# Patient Record
Sex: Male | Born: 1955 | Race: White | Hispanic: No | Marital: Married | Smoking: Never smoker
Health system: Southern US, Community
[De-identification: ages and names within clinical notes are randomized; demographics above are authoritative.]

## PROBLEM LIST (undated history)

## (undated) DIAGNOSIS — R972 Elevated prostate specific antigen [PSA]: Secondary | ICD-10-CM

## (undated) DIAGNOSIS — L02212 Cutaneous abscess of back [any part, except buttock]: Secondary | ICD-10-CM

## (undated) HISTORY — DX: Elevated prostate specific antigen (PSA): R97.20

## (undated) HISTORY — PX: JOINT REPLACEMENT: SHX530

## (undated) HISTORY — PX: TONSILLECTOMY: SUR1361

## (undated) HISTORY — PX: EYE SURGERY: SHX253

## (undated) HISTORY — DX: Cutaneous abscess of back (any part, except buttock): L02.212

---

## 2004-09-14 HISTORY — PX: COLONOSCOPY: SHX174

## 2005-05-21 ENCOUNTER — Ambulatory Visit: Payer: Self-pay | Admitting: Unknown Physician Specialty

## 2005-09-21 ENCOUNTER — Ambulatory Visit (HOSPITAL_COMMUNITY): Admission: RE | Admit: 2005-09-21 | Discharge: 2005-09-21 | Payer: Self-pay | Admitting: Orthopedic Surgery

## 2005-10-12 ENCOUNTER — Inpatient Hospital Stay (HOSPITAL_COMMUNITY): Admission: RE | Admit: 2005-10-12 | Discharge: 2005-10-16 | Payer: Self-pay | Admitting: Orthopedic Surgery

## 2005-11-23 ENCOUNTER — Inpatient Hospital Stay (HOSPITAL_COMMUNITY): Admission: RE | Admit: 2005-11-23 | Discharge: 2005-11-25 | Payer: Self-pay | Admitting: Orthopedic Surgery

## 2007-11-22 IMAGING — US US ABDOMEN COMPLETE
1 series · 14 of 25 positions shown · non-contrast
Comparison: None.

CLINICAL DATA: Fatty liver.  Elevated LFTs.
 ABDOMEN ULTRASOUND COMPLETE ? 10/15/05:
TECHNIQUE: Complete abdominal ultrasound examination was performed including evaluation of the liver, gallbladder, bile ducts, pancreas, kidneys, spleen, IVC, and abdominal aorta.

[Series 1: unknown · 0.34mm/px · 14 of 49 slices shown]
[im 1/49]
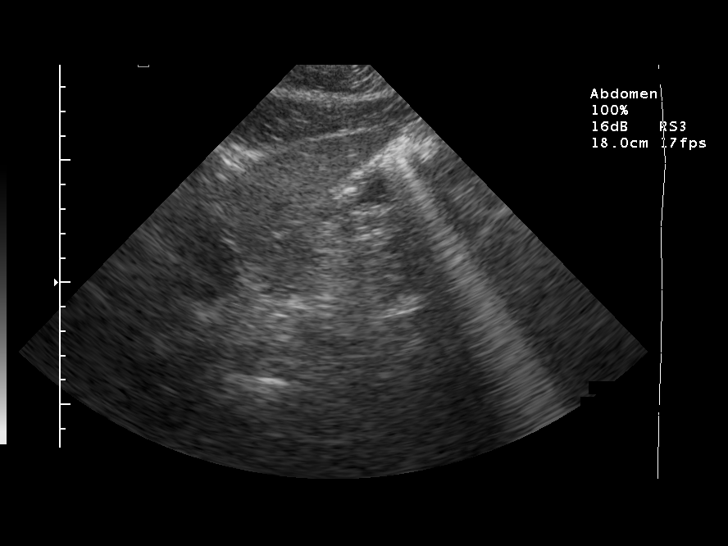
[im 5/49]
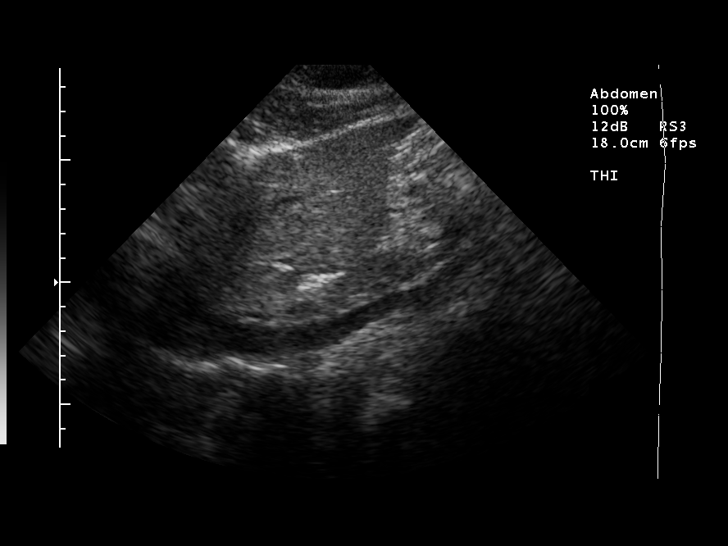
[im 9/49]
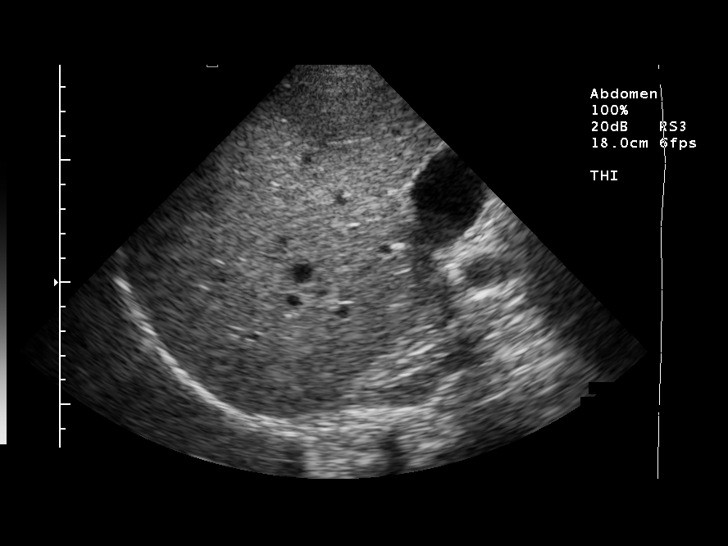
[im 13/49]
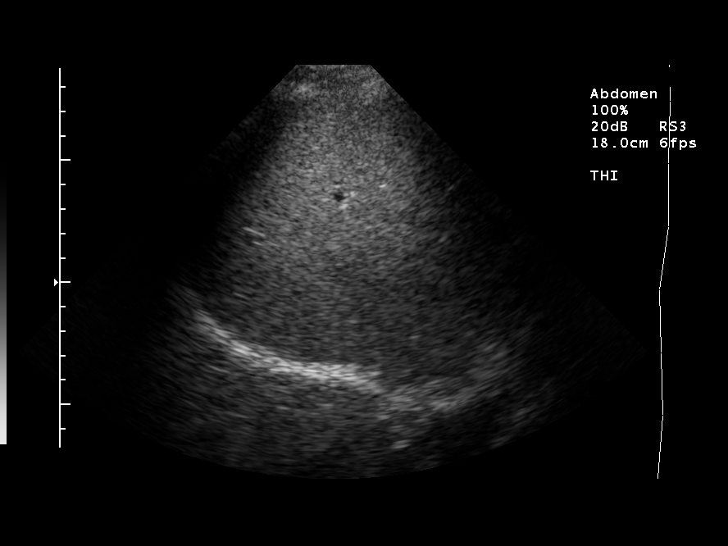
[im 17/49]
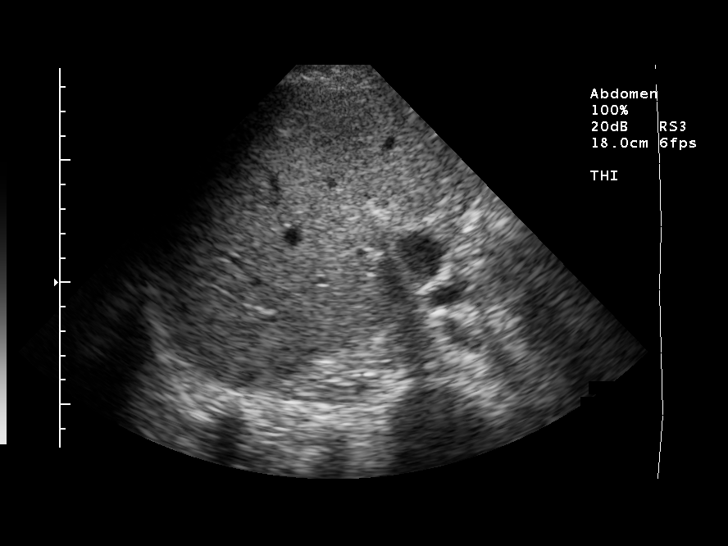
[im 19/49]
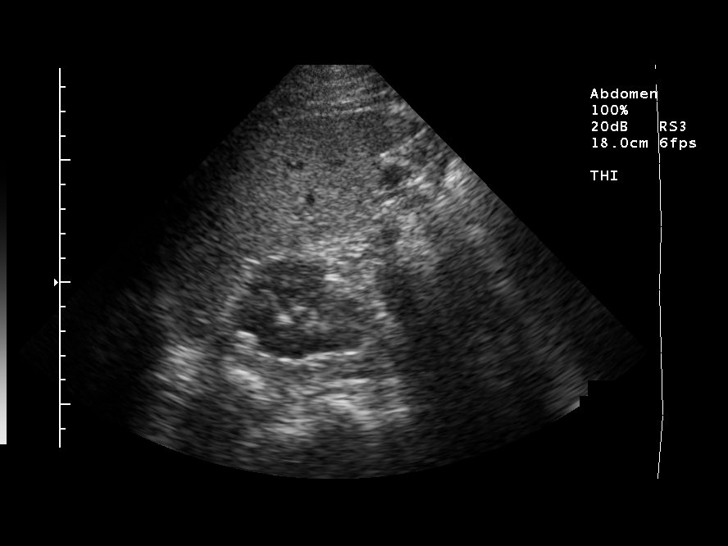
[im 23/49]
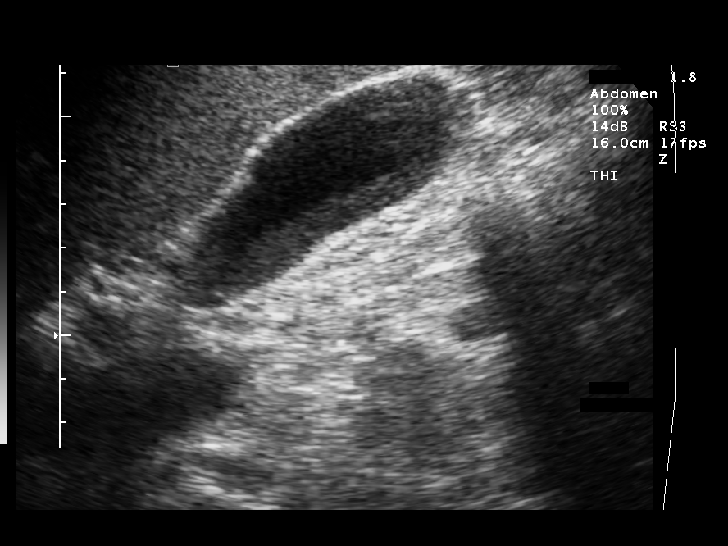
[im 27/49]
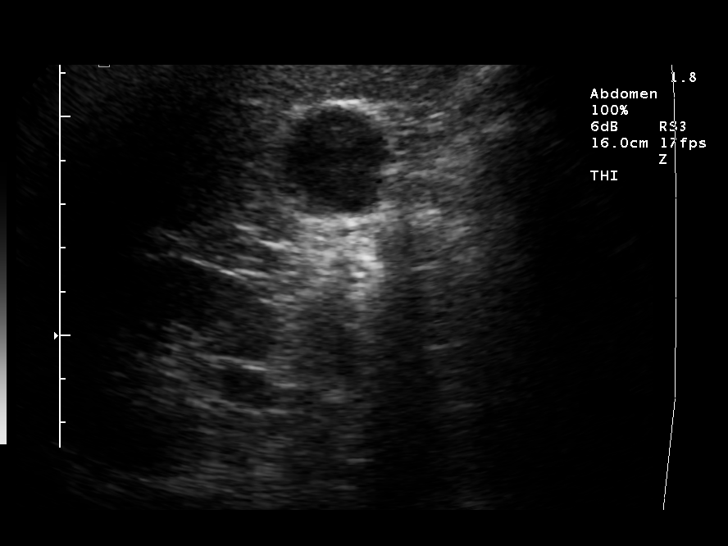
[im 31/49]
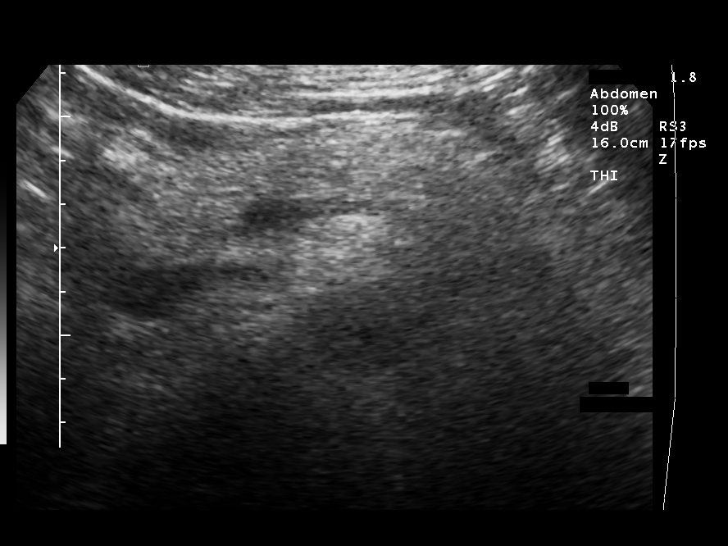
[im 33/49]
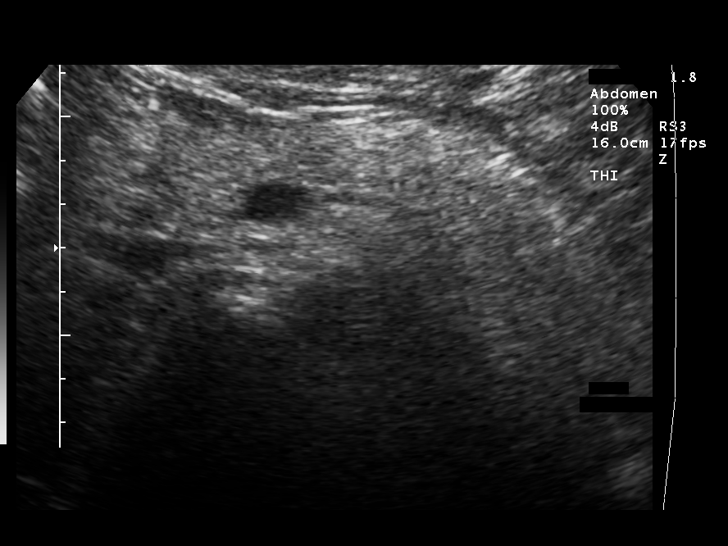
[im 37/49]
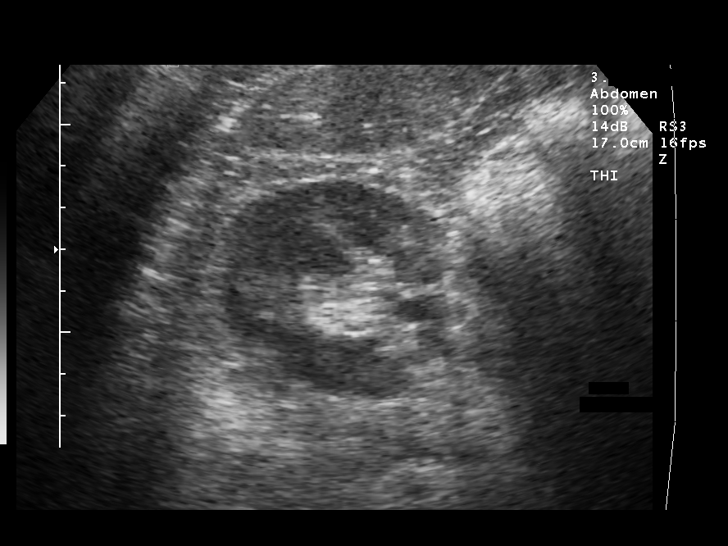
[im 41/49]
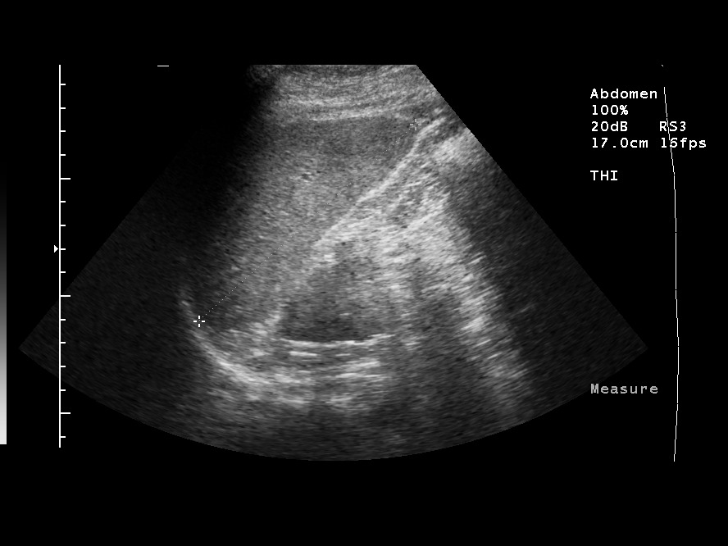
[im 45/49]
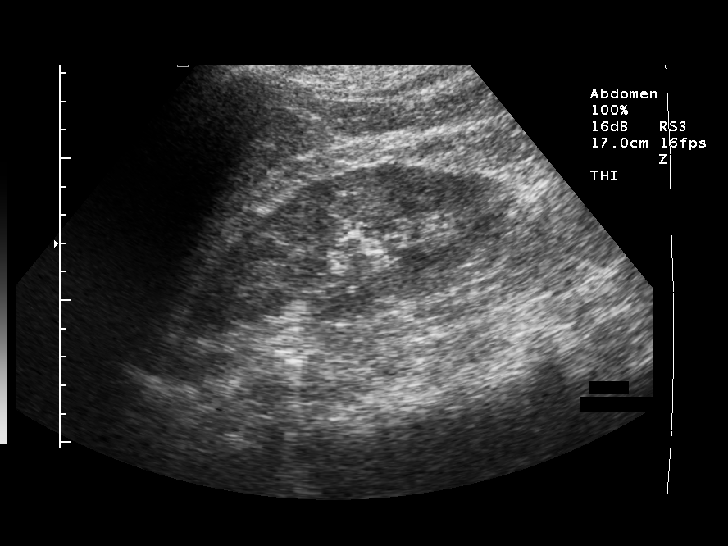
[im 49/49]
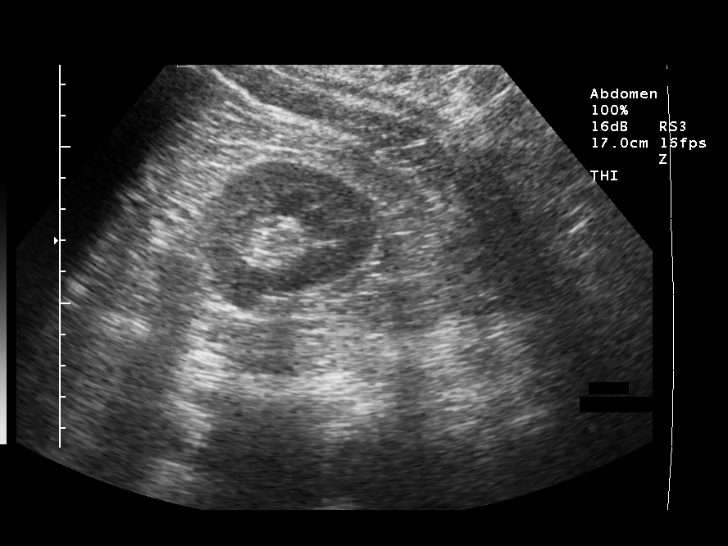

[14 of 25 positions shown; findings below may reference images not displayed]

FINDINGS: Gallbladder:  Layering sludge within the lumen.  No gallbladder wall thickening or pericholecystic fluid.  The sonographer reports no sonographic Murphy's sign.
 Common Bile Duct:  Nondilated.  
 Liver:  Upper limits of normal for size.  The parenchyma is diffusely hyperechoic and coarsened, consistent with diffuse fatty infiltration.  
 IVC:  Normal.
 Pancreas:  Normal.
 Spleen:  Normal.
 Kidneys:  The right kidney is 13.5 cm in long axis.  The left kidney measures 13.2 cm.  Both are sonographically normal.
 Abdominal Aorta:  No aneurysm.
IMPRESSION: 1.  Diffusely hyperechoic liver with coarsening of the echotexture.  The features are consistent with diffuse fatty infiltration.
 2.  Gallbladder sludge.

## 2013-10-04 ENCOUNTER — Ambulatory Visit (INDEPENDENT_AMBULATORY_CARE_PROVIDER_SITE_OTHER): Payer: Managed Care, Other (non HMO) | Admitting: General Surgery

## 2013-10-04 ENCOUNTER — Encounter: Payer: Self-pay | Admitting: General Surgery

## 2013-10-04 VITALS — BP 120/72 | HR 76 | Temp 97.8°F | Resp 14 | Ht 76.0 in | Wt 224.0 lb

## 2013-10-04 DIAGNOSIS — L03319 Cellulitis of trunk, unspecified: Secondary | ICD-10-CM

## 2013-10-04 DIAGNOSIS — L723 Sebaceous cyst: Secondary | ICD-10-CM

## 2013-10-04 DIAGNOSIS — L02219 Cutaneous abscess of trunk, unspecified: Secondary | ICD-10-CM | POA: Insufficient documentation

## 2013-10-04 NOTE — Progress Notes (Signed)
Patient ID: Cameron Gentry, male   DOB: 1956/08/15, 58 y.o.   MRN: [id number]  Chief Complaint  Patient presents with  . Obesity    New patient evaluation of lower back abscess    HPI Cameron Gentry is a 58 y.o. male who presents for an evaluation of lower back abscess. The patient states he became aware of a small nodular area in the lower back 25+ years ago.  He states within the last week or two the area has become tender and swollen. Over the past week he noted a marked increase in the size of the area and has had a little bit of drainage. He is currently on antibiotics.   HPI  History reviewed. No pertinent past medical history.  Past Surgical History  Procedure Laterality Date  . Joint replacement Bilateral     total knee replacements  . Colonoscopy  2006  . Tonsillectomy      History reviewed. No pertinent family history.  Social History History  Substance Use Topics  . Smoking status: Never Smoker   . Smokeless tobacco: Not on file  . Alcohol Use: No    No Known Allergies  Current Outpatient Prescriptions  Medication Sig Dispense Refill  . amoxicillin-clavulanate (AUGMENTIN) 875-125 MG per tablet Take 1 tablet by mouth 2 (two) times daily.       No current facility-administered medications for this visit.    Review of Systems Review of Systems  Constitutional: Positive for fever.  Respiratory: Negative.   Cardiovascular: Negative.     Blood pressure 120/72, pulse 76, temperature 97.8 F (36.6 C), temperature source Oral, resp. rate 14, height 6\' 4"  (1.93 m), weight 224 lb (101.606 kg).  Physical Exam Physical Exam  Skin:     5 x 10 area of erythema. 3 x 3 cm area of induration with multiple draining sinuses. No lymphangitic streaking.     Data Reviewed PCP notes of October 03, 2013.   Assessment    Right lower back abscess. Likely infected sebaceous cyst.      Plan    Indications for incision and drainage was discussed. The patient was  amenable to proceed. 20 cc of 0.5% Xylocaine with 0.25% Marcaine with one 200,000 epinephrine with was instilled. After skin cleansing with Betadine a 1-1/2 cm wide 2 cm long ellipse of skin was excised. The patient was still experiencing significant discomfort during debridement of the sebaceous cyst contents. The area was cleansed with peroxide and a Telfa and gauze dressing applied. The patient was instructed in regards to wound care. He may need to have the cyst lining excised with the acute inflammation has resolved.  He was encouraged to continue his present antibiotic and return in 2 weeks for reassessment.        Robert Bellow 10/04/2013, 9:19 PM

## 2013-10-08 LAB — PATHOLOGY

## 2013-10-15 DIAGNOSIS — L02212 Cutaneous abscess of back [any part, except buttock]: Secondary | ICD-10-CM

## 2013-10-15 HISTORY — DX: Cutaneous abscess of back (any part, except buttock): L02.212

## 2013-10-19 ENCOUNTER — Ambulatory Visit (INDEPENDENT_AMBULATORY_CARE_PROVIDER_SITE_OTHER): Payer: Managed Care, Other (non HMO) | Admitting: General Surgery

## 2013-10-19 ENCOUNTER — Encounter: Payer: Self-pay | Admitting: General Surgery

## 2013-10-19 VITALS — BP 130/80 | HR 74 | Temp 96.7°F | Resp 12 | Ht 76.0 in | Wt 220.0 lb

## 2013-10-19 DIAGNOSIS — L723 Sebaceous cyst: Secondary | ICD-10-CM

## 2013-10-19 NOTE — Progress Notes (Signed)
Patient ID: Cameron Gentry, male   DOB: [birth year], 58 y.o.   MRN: [id number]  Chief Complaint  Patient presents with  . Follow-up    back abscess    HPI Cameron Gentry is a 58 y.o. male here today for his two week follow up from an back abscess. Patient states the area is still draining a little bit. He states he completed the Amoxicillin without incident  HPI  No past medical history on file.  Past Surgical History  Procedure Laterality Date  . Joint replacement Bilateral     total knee replacements  . Colonoscopy  2006  . Tonsillectomy      No family history on file.  Social History History  Substance Use Topics  . Smoking status: Never Smoker   . Smokeless tobacco: Never Used  . Alcohol Use: No    No Known Allergies  No current outpatient prescriptions on file.   No current facility-administered medications for this visit.    Review of Systems Review of Systems  Constitutional: Negative.   Respiratory: Negative.   Cardiovascular: Negative.     Blood pressure 130/80, pulse 74, temperature 96.7 F (35.9 C), resp. rate 12, height 6\' 4"  (1.93 m), weight 220 lb (99.791 kg).  Physical Exam Physical Exam  Skin:     2 by 3cm thickening in the right lower back      Assessment    Marked reduction inflammation and sebaceous cyst infection of the right lower back.    Plan    We'll plan for final assessment in 3 weeks. If there is residual thickening formal excision of the cyst wall will be undertaken. If inflammation is completely resolved observation will be planned.       Robert Bellow 10/23/2013, 8:25 AM

## 2013-10-19 NOTE — Patient Instructions (Signed)
Patient to return in three weeks.  

## 2013-11-09 ENCOUNTER — Ambulatory Visit: Payer: Managed Care, Other (non HMO) | Admitting: General Surgery

## 2013-11-30 ENCOUNTER — Encounter: Payer: Self-pay | Admitting: General Surgery

## 2013-11-30 ENCOUNTER — Ambulatory Visit (INDEPENDENT_AMBULATORY_CARE_PROVIDER_SITE_OTHER): Payer: Managed Care, Other (non HMO) | Admitting: General Surgery

## 2013-11-30 VITALS — BP 140/84 | HR 76 | Resp 12 | Ht 76.0 in | Wt 225.0 lb

## 2013-11-30 DIAGNOSIS — L723 Sebaceous cyst: Secondary | ICD-10-CM

## 2013-11-30 NOTE — Progress Notes (Signed)
Patient ID: Cameron Gentry, male   DOB: October 18, 1955, 58 y.o.   MRN: [id number]  Chief Complaint  Patient presents with  . Follow-up    back abscess    HPI Cameron Gentry is a 58 y.o. male.  Here today for follow up of a back abscess. He states the area is healed and no pain. HPI  Past Medical History  Diagnosis Date  . Abscess of back Feb 2015    Past Surgical History  Procedure Laterality Date  . Joint replacement Bilateral     total knee replacements  . Colonoscopy  2006  . Tonsillectomy      No family history on file.  Social History History  Substance Use Topics  . Smoking status: Never Smoker   . Smokeless tobacco: Never Used  . Alcohol Use: No    No Known Allergies  No current outpatient prescriptions on file.   No current facility-administered medications for this visit.    Review of Systems Review of Systems  Constitutional: Negative.   Respiratory: Negative.   Cardiovascular: Negative.     Blood pressure 140/84, pulse 76, resp. rate 12, height 6\' 4"  (1.93 m), weight 225 lb (102.059 kg).  Physical Exam Physical Exam  Constitutional: He is oriented to person, place, and time. He appears well-developed and well-nourished.  Neurological: He is alert and oriented to person, place, and time.  Skin: Skin is warm, dry and intact.  1 cm area of skin thickening lower central back  No residual inflammation, scant dermal thickening.     Assessment    Sebaceous cyst abscess, resolving.     Plan    A less severe it become symptomatic, observation alone is reasonable. The patient was encouraged to call if he has any recurrent difficulties.        Robert Bellow 12/02/2013, 7:43 AM

## 2013-11-30 NOTE — Patient Instructions (Signed)
The patient is aware to call back for any questions or concerns.  

## 2015-05-16 ENCOUNTER — Encounter: Payer: Self-pay | Admitting: *Deleted

## 2015-05-17 ENCOUNTER — Ambulatory Visit: Payer: Managed Care, Other (non HMO) | Admitting: Anesthesiology

## 2015-05-17 ENCOUNTER — Encounter: Payer: Self-pay | Admitting: *Deleted

## 2015-05-17 ENCOUNTER — Ambulatory Visit
Admission: RE | Admit: 2015-05-17 | Discharge: 2015-05-17 | Disposition: A | Discharge status: Home / Self Care | Payer: Managed Care, Other (non HMO) | Source: Ambulatory Visit | Attending: Unknown Physician Specialty | Admitting: Unknown Physician Specialty

## 2015-05-17 ENCOUNTER — Encounter
Admission: RE | Disposition: A | Discharge status: Home / Self Care | Payer: Self-pay | Source: Ambulatory Visit | Attending: Unknown Physician Specialty

## 2015-05-17 DIAGNOSIS — D122 Benign neoplasm of ascending colon: Secondary | ICD-10-CM | POA: Diagnosis not present

## 2015-05-17 DIAGNOSIS — Z1211 Encounter for screening for malignant neoplasm of colon: Secondary | ICD-10-CM | POA: Diagnosis present

## 2015-05-17 DIAGNOSIS — K529 Noninfective gastroenteritis and colitis, unspecified: Secondary | ICD-10-CM | POA: Diagnosis not present

## 2015-05-17 DIAGNOSIS — K5730 Diverticulosis of large intestine without perforation or abscess without bleeding: Secondary | ICD-10-CM | POA: Diagnosis not present

## 2015-05-17 HISTORY — PX: COLONOSCOPY WITH PROPOFOL: SHX5780

## 2015-05-17 SURGERY — COLONOSCOPY WITH PROPOFOL
Anesthesia: General

## 2015-05-17 MED ORDER — FENTANYL CITRATE (PF) 100 MCG/2ML IJ SOLN
INTRAMUSCULAR | Status: DC | PRN
  Administered 2015-05-17: 50 ug via INTRAVENOUS

## 2015-05-17 MED ORDER — SODIUM CHLORIDE 0.9 % IV SOLN
INTRAVENOUS | Status: DC
  Administered 2015-05-17: 15:00:00 via INTRAVENOUS

## 2015-05-17 MED ORDER — PROPOFOL INFUSION 10 MG/ML OPTIME
INTRAVENOUS | Status: DC | PRN
  Administered 2015-05-17: 140 ug/kg/min via INTRAVENOUS

## 2015-05-17 MED ORDER — PIPERACILLIN-TAZOBACTAM 3.375 G IVPB 30 MIN
3.3750 g | Freq: Once | INTRAVENOUS | Status: AC
  Administered 2015-05-17: 3.375 g via INTRAVENOUS
  Filled 2015-05-17: qty 50

## 2015-05-17 MED ORDER — SODIUM CHLORIDE 0.9 % IV SOLN: INTRAVENOUS | Status: DC

## 2015-05-17 MED ORDER — PROPOFOL 10 MG/ML IV BOLUS
INTRAVENOUS | Status: DC | PRN
  Administered 2015-05-17: 100 mg via INTRAVENOUS

## 2015-05-17 NOTE — Transfer of Care (Signed)
Immediate Anesthesia Transfer of Care Note  Patient: Cameron Gentry  Procedure(s) Performed: Procedure(s): COLONOSCOPY WITH PROPOFOL (N/A)  Patient Location: PACU and Endoscopy Unit  Anesthesia Type:General  Level of Consciousness: awake  Airway & Oxygen Therapy: Patient Spontanous Breathing  Post-op Assessment: Report given to RN  Post vital signs: stable  Last Vitals:  Filed Vitals:   05/17/15 1357  BP: 118/84  Pulse: 77  Temp: 36.1 C  Resp: 18    Complications: No apparent anesthesia complications

## 2015-05-17 NOTE — Op Note (Signed)
[facility] Gastroenterology Patient Name: Cameron Gentry Procedure Date: 05/17/2015 2:43 PM MRN: [id number] Account #: [id number] Date of Birth: June 12, 1956 Admit Type: Outpatient Age: 59 Room: [facility] ENDO ROOM 1 Gender: Male Note Status: Finalized Procedure:         Colonoscopy Indications:       Screening for colorectal malignant neoplasm Providers:         Manya Silvas, MD Referring MD:      Ocie Cornfield. Ouida Sills, MD (Referring MD) Medicines:         Propofol per Anesthesia Complications:     No immediate complications. Procedure:         Pre-Anesthesia Assessment:                    - After reviewing the risks and benefits, the patient was                     deemed in satisfactory condition to undergo the procedure.                    After obtaining informed consent, the colonoscope was                     passed under direct vision. Throughout the procedure, the                     patient's blood pressure, pulse, and oxygen saturations                     were monitored continuously. The Olympus PCF-H180AL                     colonoscope ( S#: [id number] ) was introduced through the                     anus and advanced to the the cecum, identified by                     appendiceal orifice and ileocecal valve. The colonoscopy                     was performed without difficulty. The patient tolerated                     the procedure well. The quality of the bowel preparation                     was excellent. Findings:      A diminutive polyp was found in the ascending colon. The polyp was       sessile. The polyp was removed with a jumbo cold forceps. Resection and       retrieval were complete.      The exam was otherwise without abnormality.      Multiple small-mouthed diverticula were found in the sigmoid colon and       in the descending colon.      The exam was otherwise without abnormality.      A localized area of granular mucosa was found in the  cecum. Biopsies       were taken with a cold forceps for histology. Biopsies for histology       were taken with a cold forceps for evaluation of microscopic colitis. Impression:        - One diminutive polyp  in the cecum. Resected and                     retrieved.                    - One diminutive polyp in the ascending colon. Resected                     and retrieved.                    - The examination was otherwise normal. Recommendation:    - Await pathology results. Manya Silvas, MD 05/17/2015 3:08:48 PM This report has been signed electronically. Number of Addenda: 0 Note Initiated On: 05/17/2015 2:43 PM Scope Withdrawal Time: 0 hours 9 minutes 9 seconds  Total Procedure Duration: 0 hours 12 minutes 59 seconds       [facility]

## 2015-05-17 NOTE — Anesthesia Preprocedure Evaluation (Signed)
Anesthesia Evaluation  Patient identified by MRN, date of birth, ID band Patient awake    Reviewed: Allergy & Precautions, H&P , NPO status , Patient's Chart, lab work & pertinent test results, reviewed documented beta blocker date and time   History of Anesthesia Complications Negative for: history of anesthetic complications  Airway Mallampati: II  TM Distance: >3 FB Neck ROM: full    Dental no notable dental hx. (+) Teeth Intact   Pulmonary neg pulmonary ROS,  breath sounds clear to auscultation  Pulmonary exam normal       Cardiovascular Exercise Tolerance: Good negative cardio ROS Normal cardiovascular examRhythm:regular Rate:Normal     Neuro/Psych negative neurological ROS  negative psych ROS   GI/Hepatic negative GI ROS, Neg liver ROS,   Endo/Other  negative endocrine ROS  Renal/GU negative Renal ROS  negative genitourinary   Musculoskeletal   Abdominal   Peds  Hematology negative hematology ROS (+)   Anesthesia Other Findings Past Medical History:   Abscess of back                                 Feb 2015     Reproductive/Obstetrics negative OB ROS                             Anesthesia Physical Anesthesia Plan  ASA: I  Anesthesia Plan: General   Post-op Pain Management:    Induction:   Airway Management Planned:   Additional Equipment:   Intra-op Plan:   Post-operative Plan:   Informed Consent: I have reviewed the patients History and Physical, chart, labs and discussed the procedure including the risks, benefits and alternatives for the proposed anesthesia with the patient or authorized representative who has indicated his/her understanding and acceptance.   Dental Advisory Given  Plan Discussed with: Anesthesiologist, CRNA and Surgeon  Anesthesia Plan Comments:         Anesthesia Quick Evaluation

## 2015-05-17 NOTE — H&P (Signed)
   Primary Care Physician:  Kirk Ruths., MD Primary Gastroenterologist:  Dr. Vira Agar  Pre-Procedure History & Physical: HPI:  Cameron Gentry is a 59 y.o. male is here for an colonoscopy.   Past Medical History  Diagnosis Date  . Abscess of back Feb 2015    Past Surgical History  Procedure Laterality Date  . Joint replacement Bilateral     total knee replacements  . Colonoscopy  2006  . Tonsillectomy    . Eye surgery      Laser Eye Sugery after torn retina (Jan 2016)    Prior to Admission medications   Not on File    Allergies as of 04/19/2015  . (No Known Allergies)    History reviewed. No pertinent family history.  Social History   Social History  . Marital Status: Married    Spouse Name: N/A  . Number of Children: N/A  . Years of Education: N/A   Occupational History  . Not on file.   Social History Main Topics  . Smoking status: Never Smoker   . Smokeless tobacco: Never Used  . Alcohol Use: No  . Drug Use: No  . Sexual Activity: Not on file   Other Topics Concern  . Not on file   Social History Narrative    Review of Systems: See HPI, otherwise negative ROS  Physical Exam: BP 118/84 mmHg  Pulse 77  Temp(Src) 97 F (36.1 C) (Tympanic)  Resp 18  Ht 6\' 4"  (1.93 m)  Wt 102.059 kg (225 lb)  BMI 27.40 kg/m2  SpO2 97% General:   Alert,  pleasant and cooperative in NAD Head:  Normocephalic and atraumatic. Neck:  Supple; no masses or thyromegaly. Lungs:  Clear throughout to auscultation.    Heart:  Regular rate and rhythm. Abdomen:  Soft, nontender and nondistended. Normal bowel sounds, without guarding, and without rebound.   Neurologic:  Alert and  oriented x4;  grossly normal neurologically.  Impression/Plan: Cameron Gentry is here for an colonoscopy to be performed for Screening colonoscopy  Risks, benefits, limitations, and alternatives regarding  colonoscopy have been reviewed with the patient.  Questions have been answered.   All parties agreeable.   Gaylyn Cheers, MD  05/17/2015, 2:46 PM

## 2015-05-18 NOTE — Anesthesia Postprocedure Evaluation (Signed)
  Anesthesia Post-op Note  Patient: Cameron Gentry  Procedure(s) Performed: Procedure(s): COLONOSCOPY WITH PROPOFOL (N/A)  Anesthesia type:General  Patient location: PACU  Post pain: Pain level controlled  Post assessment: Post-op Vital signs reviewed, Patient's Cardiovascular Status Stable, Respiratory Function Stable, Patent Airway and No signs of Nausea or vomiting  Post vital signs: Reviewed and stable  Last Vitals:  Filed Vitals:   05/17/15 1541  BP: 132/93  Pulse: 57  Temp:   Resp: 13    Level of consciousness: awake, alert  and patient cooperative  Complications: No apparent anesthesia complications

## 2015-05-21 ENCOUNTER — Encounter: Payer: Self-pay | Admitting: Unknown Physician Specialty

## 2015-05-22 LAB — SURGICAL PATHOLOGY

## 2015-05-24 NOTE — Addendum Note (Signed)
Addendum  created 05/24/15 1453 by Martha Clan, MD   Modules edited: Anesthesia Attestations

## 2019-09-20 ENCOUNTER — Other Ambulatory Visit: Payer: Self-pay

## 2019-09-20 ENCOUNTER — Encounter: Payer: Self-pay | Admitting: Urology

## 2019-09-20 ENCOUNTER — Ambulatory Visit (INDEPENDENT_AMBULATORY_CARE_PROVIDER_SITE_OTHER): Payer: Managed Care, Other (non HMO) | Admitting: Urology

## 2019-09-20 VITALS — BP 138/82 | HR 88 | Ht 76.0 in | Wt 225.0 lb

## 2019-09-20 DIAGNOSIS — R972 Elevated prostate specific antigen [PSA]: Secondary | ICD-10-CM

## 2019-09-20 NOTE — Progress Notes (Signed)
09/20/2019 10:53 AM   Cameron Gentry [birth year] [id number]  Referring provider: Kirk Ruths, MD [place] [facility] [place],  [place] [address]  Chief Complaint  Patient presents with  . Elevated PSA    HPI: 64 year old male referred for further evaluation of elevated PSA.  His PSA has been in the 2-3 range since 2015.  More recently, he had a bump to 4.32 on 08/01/2019.  There is was up significantly from the previous year of 2.57 on 07/2018.  No personal history of elevated PSA.  He has never seen a urologist.  No significant urinary symptoms.  He does get up once in the early morning hours to void and has been doing so for about 10 years.  This not bothersome to him.  He has no daytime symptoms.  He does empty his bladder completely.  No history of UTIs.  He does have a family history of prostate cancer, his dad was diagnosed in his 2s.  Ended up succumbing to heart disease.  He does not recall whether or not his father had treatment.  No weight loss or bone pain.   PMH: Past Medical History:  Diagnosis Date  . Abscess of back Feb 2015    Surgical History: Past Surgical History:  Procedure Laterality Date  . COLONOSCOPY  2006  . COLONOSCOPY WITH PROPOFOL N/A 05/17/2015   Procedure: COLONOSCOPY WITH PROPOFOL;  Surgeon: Manya Silvas, MD;  Location: [facility] ENDOSCOPY;  Service: Endoscopy;  Laterality: N/A;  . EYE SURGERY     Laser Eye Sugery after torn retina (Jan 2016)  . JOINT REPLACEMENT Bilateral    total knee replacements  . TONSILLECTOMY      Home Medications:  Allergies as of 09/20/2019   No Known Allergies     Medication List    as of September 20, 2019 10:53 AM   You have not been prescribed any medications.     Allergies: No Known Allergies  Family History: Family History  Problem Relation Age of Onset  . Prostate cancer Father     Social History:  reports that he has never smoked. He has never used smokeless  tobacco. He reports that he does not drink alcohol or use drugs.  ROS: UROLOGY Frequent Urination?: No Hard to postpone urination?: No Burning/pain with urination?: No Get up at night to urinate?: No Leakage of urine?: No Urine stream starts and stops?: No Trouble starting stream?: No Do you have to strain to urinate?: No Blood in urine?: No Urinary tract infection?: No Sexually transmitted disease?: No Injury to kidneys or bladder?: No Painful intercourse?: No Weak stream?: No Erection problems?: No Penile pain?: No  Gastrointestinal Nausea?: No Vomiting?: No Indigestion/heartburn?: No Diarrhea?: No Constipation?: No  Constitutional Fever: No Night sweats?: No Weight loss?: No Fatigue?: No  Skin Skin rash/lesions?: No Itching?: No  Eyes Blurred vision?: No Double vision?: No  Ears/Nose/Throat Sore throat?: No Sinus problems?: No  Hematologic/Lymphatic Swollen glands?: No Easy bruising?: No  Cardiovascular Leg swelling?: No Chest pain?: No  Respiratory Cough?: No Shortness of breath?: No  Endocrine Excessive thirst?: No  Musculoskeletal Back pain?: No Joint pain?: No  Neurological Headaches?: No Dizziness?: No  Psychologic Depression?: No Anxiety?: No  Physical Exam: BP 138/82   Pulse 88   Ht 6\' 4"  (1.93 m)   Wt 225 lb (102.1 kg)   BMI 27.39 kg/m   Constitutional:  Alert and oriented, No acute distress. HEENT: [place] AT, moist  mucus membranes.  Trachea midline, no masses. Cardiovascular: No clubbing, cyanosis, or edema. Respiratory: Normal respiratory effort, no increased work of breathing. GI: Abdomen is soft, nontender, nondistended, no abdominal masses Rectal: Large external hemorrhoids present, noninflamed.  Normal sphincter tone.  40 cc prostate, nontender no nodules.  Skin: No rashes, bruises or suspicious lesions. Neurologic: Grossly intact, no focal deficits, moving all 4 extremities. Psychiatric: Normal mood and affect.   Laboratory Data: PSA as above   Assessment & Plan:    1. Elevated prostate specific antigen (PSA)  We reviewed the implications of an elevated PSA and the uncertainty surrounding it. In general, a man's PSA increases with age and is produced by both normal and cancerous prostate tissue. Differential for elevated PSA is BPH, prostate cancer, infection, recent intercourse/ejaculation, prostate infarction, recent urethroscopic manipulation (foley placement/cystoscopy) and prostatitis. Management of an elevated PSA can include observation or prostate biopsy and wediscussed this in detail. We discussed that indications for prostate biopsy are defined by age and race specific PSA cutoffs as well as a PSA velocity of 0.75/year.  PSA was repeated today.  Possible that his PSA may trend back towards his baseline.  If his PSA remains elevated or continues to rise, would recommend prostate biopsy.  We discussed prostate biopsy in detail including the procedure itself, the risks of blood in the urine, stool, and ejaculate, serious infection, and discomfort. He is willing to proceed with this as discussed.  - PSA  We will call tomorrow with PSA results and recommendations  Hollice Espy, MD  [facility] [address], [place], [place] [address] [phone]

## 2019-09-21 LAB — PSA: Prostate Specific Ag, Serum: 2.5 ng/mL (ref 0.0–4.0)

## 2019-09-22 ENCOUNTER — Telehealth: Payer: Self-pay | Admitting: *Deleted

## 2019-09-22 ENCOUNTER — Telehealth: Payer: Self-pay | Admitting: Urology

## 2019-09-22 NOTE — Telephone Encounter (Addendum)
Patient informed-verbalized understanding.   ----- Message from Hollice Espy, MD sent at 09/22/2019  8:31 AM EST ----- Awesome news, your repeat PSA is 2.5.  No need for biopsy or further follow-up with me.  He can follow-up with your primary for annual PSA screening.  Hollice Espy, MD

## 2019-09-22 NOTE — Telephone Encounter (Signed)
Pt lmom wanting a call back with his lab results.

## 2019-09-25 NOTE — Telephone Encounter (Signed)
Previously addressed.

## 2023-10-26 ENCOUNTER — Ambulatory Visit
Admission: EM | Admit: 2023-10-26 | Discharge: 2023-10-26 | Disposition: A | Discharge status: Home / Self Care | Payer: Medicare Other | Attending: Emergency Medicine | Admitting: Emergency Medicine

## 2023-10-26 DIAGNOSIS — H938X3 Other specified disorders of ear, bilateral: Secondary | ICD-10-CM | POA: Diagnosis not present

## 2023-10-26 NOTE — ED Triage Notes (Signed)
Patient to Urgent Care with complaints of bilateral ear fullness/ muffled hearing that started three months ago. Denies pain.  Requests ear cleaning- reports he wears ear protection for work/ works around a lot of dust.

## 2023-10-26 NOTE — Discharge Instructions (Addendum)
Your exam there is no wax in the right ear and only a small amount of wax in the left, wax in the left ear has been removed  Do believe symptoms are related to your sinuses as you recently were sick  You have declined use of antibiotic due to recent use, you have also declined nasal spray and steroids as well as a referral to ear nose and throat  If her symptoms continue to persist please follow-up with your primary doctor

## 2023-10-26 NOTE — ED Provider Notes (Signed)
Renaldo Fiddler    CSN: [id number] Arrival date & time: 10/26/23  7829      History   Chief Complaint Chief Complaint  Patient presents with   Ear Fullness    HPI AVIV LENGACHER is a 68 y.o. male.   Patient presents for evaluation of bilateral ear fullness with decreased hearing beginning 3 months ago.  Denies ear pain or drainage.  Wears protective airway for work and believes that they may be clogged, requesting cleansing.  Recent flu bronchitis, took Levaquin for treatment.  Past Medical History:  Diagnosis Date   Abscess of back Feb 2015    Patient Active Problem List   Diagnosis Date Noted   Sebaceous cyst 10/04/2013   Cellulitis and abscess of trunk 10/04/2013    Past Surgical History:  Procedure Laterality Date   COLONOSCOPY  2006   COLONOSCOPY WITH PROPOFOL N/A 05/17/2015   Procedure: COLONOSCOPY WITH PROPOFOL;  Surgeon: Scot Jun, MD;  Location: [facility] ENDOSCOPY;  Service: Endoscopy;  Laterality: N/A;   EYE SURGERY     Laser Eye Sugery after torn retina (Jan 2016)   JOINT REPLACEMENT Bilateral    total knee replacements   TONSILLECTOMY         Home Medications    Prior to Admission medications   Not on File    Family History Family History  Problem Relation Age of Onset   Prostate cancer Father     Social History Social History   Tobacco Use   Smoking status: Never   Smokeless tobacco: Never  Substance Use Topics   Alcohol use: No   Drug use: No     Allergies   Patient has no known allergies.   Review of Systems Review of Systems   Physical Exam Triage Vital Signs ED Triage Vitals  Encounter Vitals Group     BP 10/26/23 1015 129/86     Systolic BP Percentile --      Diastolic BP Percentile --      Pulse Rate 10/26/23 1015 70     Resp 10/26/23 1015 18     Temp 10/26/23 1015 98.2 F (36.8 C)     Temp src --      SpO2 10/26/23 1015 98 %     Weight --      Height --      Head Circumference --      Peak Flow  --      Pain Score 10/26/23 1012 0     Pain Loc --      Pain Education --      Exclude from Growth Chart --    No data found.  Updated Vital Signs BP 129/86   Pulse 70   Temp 98.2 F (36.8 C)   Resp 18   SpO2 98%   Visual Acuity Right Eye Distance:   Left Eye Distance:   Bilateral Distance:    Right Eye Near:   Left Eye Near:    Bilateral Near:     Physical Exam Constitutional:      Appearance: Normal appearance.  HENT:     Right Ear: Tympanic membrane, ear canal and external ear normal.     Left Ear: Tympanic membrane normal.     Ears:     Comments: Partial earwax impaction affecting 50% of the left ear canal Eyes:     Extraocular Movements: Extraocular movements intact.  Pulmonary:     Effort: Pulmonary effort is normal.  Neurological:  Mental Status: He is alert and oriented to person, place, and time.      UC Treatments / Results  Labs (all labs ordered are listed, but only abnormal results are displayed) Labs Reviewed - No data to display  EKG   Radiology No results found.  Procedures Procedures (including critical care time)  Medications Ordered in UC Medications - No data to display  Initial Impression / Assessment and Plan / UC Course  I have reviewed the triage vital signs and the nursing notes.  Pertinent labs & imaging results that were available during my care of the patient were reviewed by me and considered in my medical decision making (see chart for details).  Bilateral ear fullness  Normality to the right ear, partial impaction to the left side, water irrigation completed, successful, discussed with patient that symptoms are most likely related however due to recent antibiotic use it declined additional treatment, endorses that he does not tolerate steroids well therefore declined as well as prescription for nasal rate, offered referral to ear nose and throat, declined therefore recommended follow-up with PCP if symptoms  persist Final Clinical Impressions(s) / UC Diagnoses   Final diagnoses:  Ear fullness, bilateral     Discharge Instructions      Your exam there is no wax in the right ear and only a small amount of wax in the left, wax in the left ear has been removed  Do believe symptoms are related to your sinuses as you recently were sick  You have declined use of antibiotic due to recent use, you have also declined nasal spray and steroids as well as a referral to ear nose and throat  If her symptoms continue to persist please follow-up with your primary doctor   ED Prescriptions   None    PDMP not reviewed this encounter.   Valinda Hoar, NP 10/26/23 1040

## 2023-10-27 ENCOUNTER — Ambulatory Visit: Payer: Managed Care, Other (non HMO)

## 2024-03-16 ENCOUNTER — Encounter: Payer: Self-pay | Admitting: Urology

## 2024-03-16 ENCOUNTER — Ambulatory Visit: Admitting: Urology

## 2024-03-16 VITALS — BP 150/95 | HR 72 | Ht 76.0 in | Wt 203.1 lb

## 2024-03-16 DIAGNOSIS — R972 Elevated prostate specific antigen [PSA]: Secondary | ICD-10-CM

## 2024-03-16 NOTE — Progress Notes (Signed)
 I, Maysun LITTIE Griffiths, acting as a scribe for Glendia JAYSON Barba, MD., have documented all relevant documentation on the behalf of Glendia JAYSON Barba, MD, as directed by Glendia JAYSON Barba, MD while in the presence of Glendia JAYSON Barba, MD.  03/16/2024 2:32 PM   Cameron Gentry 18-Oct-1955 [id number]  Referring provider: Lenon Layman ORN, MD [address] [facility] [address] [place],  [place] [address]  Chief Complaint  Patient presents with   Elevated PSA    HPI: Cameron Gentry is a 68 y.o. male referred for evaluation of an elevated PSA.   PSA drawn at his annual physical, 08/16/23 was elevated at 4.56. Repeat PSA June 2025 was 4.5.  Baseline PSA in the mid 2 range Saw Dr Penne in 2021 for a PSA bump of 4/32, however a repeat PSA returned to baseline. No bothersome lower urinary tract symptoms.  Family history of prostate cancer and his father who was diagnosed in his late 42s No other previous urologic history Denies dysuria or gross hematuria.    PMH: Past Medical History:  Diagnosis Date   Abscess of back 10/15/2013   Elevated PSA     Surgical History: Past Surgical History:  Procedure Laterality Date   COLONOSCOPY  2006   COLONOSCOPY WITH PROPOFOL  N/A 05/17/2015   Procedure: COLONOSCOPY WITH PROPOFOL ;  Surgeon: Lamar ONEIDA Holmes, MD;  Location: [facility] ENDOSCOPY;  Service: Endoscopy;  Laterality: N/A;   EYE SURGERY     Laser Eye Sugery after torn retina (Jan 2016)   JOINT REPLACEMENT Bilateral    total knee replacements   TONSILLECTOMY      Home Medications:  Allergies as of 03/16/2024   No Known Allergies      Medication List        Accurate as of March 16, 2024  2:32 PM. If you have any questions, ask your nurse or doctor.          ascorbic acid 1000 MG tablet Commonly known as: VITAMIN C Take 1,000 mg by mouth daily.   cyanocobalamin 1000 MCG tablet Commonly known as: VITAMIN B12 Take 1,000 mcg by mouth daily.   VITAMIN A PO Take by mouth  daily.   Vitamin D-1000 Max St 25 MCG (1000 UT) tablet Generic drug: Cholecalciferol Take 1,000 Units by mouth daily.   ZINC PO Take 1 tablet by mouth daily.        Allergies: No Known Allergies  Family History: Family History  Problem Relation Age of Onset   Prostate cancer Father     Social History:  reports that he has never smoked. He has never used smokeless tobacco. He reports that he does not drink alcohol and does not use drugs.   Physical Exam: BP (!) 150/95 (BP Location: Left Arm, Patient Position: Sitting, Cuff Size: Large)   Pulse 72   Ht 6' 4 (1.93 m)   Wt 203 lb 1.6 oz (92.1 kg)   SpO2 96%   BMI 24.72 kg/m   Constitutional:  Alert and oriented, No acute distress. HEENT: [place] AT Respiratory: Normal respiratory effort, no increased work of breathing. GU: Prostate 50 grams, smooth without nodules. Psychiatric: Normal mood and affect.   Assessment & Plan:    1. Elevated PSA Benign DRE Although PSA is a prostate cancer screening test he was informed that cancer is not the most common cause of an elevated PSA. Other potential causes including BPH and inflammation were discussed.  He was informed that  the only way to adequately diagnose prostate cancer would be transrectal ultrasound and biopsy of the prostate. The procedure was discussed including potential risks of bleeding and infection/sepsis. He was also informed that a negative biopsy does not conclusively rule out the possibility that prostate cancer may be present and that continued monitoring is required.  The use of newer adjunctive blood and urine tests to predict the probability of high-grade prostate cancer were discussed. The use of multiparametric prostate MRI to evaluate for lesions suspicious for high grade prostate cancer and aid in targeted bx was reviewed.  Continued periodic surveillance was also discussed. He has elected to proceed with further evaluation with a prostate MRI.  I have  reviewed the above documentation for accuracy and completeness, and I agree with the above.   Glendia JAYSON Barba, MD  [facility] [address], [address] [place], [place] [address] [phone]

## 2024-03-16 NOTE — Patient Instructions (Addendum)
 Please contact Central Scheduling to set up your prostate MRI at [phone].  Prostate MRI Prep:  1- No ejaculation 48 hours prior to exam  2- No caffeine or carbonated beverages on day of the exam  3- Eat light diet evening prior and day of exam  4- Avoid eating 4 hours prior to exam  5- Fleets enema needs to be done 4 hours prior to exam -See below. Can be purchased at the drug store.

## 2024-03-24 ENCOUNTER — Ambulatory Visit
Admission: RE | Admit: 2024-03-24 | Discharge: 2024-03-24 | Disposition: A | Discharge status: Home / Self Care | Source: Ambulatory Visit | Attending: Urology | Admitting: Urology

## 2024-03-24 DIAGNOSIS — R972 Elevated prostate specific antigen [PSA]: Secondary | ICD-10-CM | POA: Diagnosis present

## 2024-03-24 MED ORDER — GADOBUTROL 1 MMOL/ML IV SOLN
9.0000 mL | Freq: Once | INTRAVENOUS | Status: AC | PRN
  Administered 2024-03-24: 9 mL via INTRAVENOUS

## 2024-03-28 ENCOUNTER — Ambulatory Visit: Payer: Self-pay | Admitting: Urology

## 2024-03-29 ENCOUNTER — Encounter: Payer: Self-pay | Admitting: *Deleted

## 2024-04-03 NOTE — Progress Notes (Signed)
 Left message on wife phone to have Cameron Gentry return the call

## 2024-05-17 ENCOUNTER — Other Ambulatory Visit: Payer: Self-pay

## 2024-06-01 ENCOUNTER — Ambulatory Visit: Admitting: Urology

## 2024-06-01 VITALS — BP 132/85 | HR 77 | Wt 205.0 lb

## 2024-06-01 DIAGNOSIS — Z2989 Encounter for other specified prophylactic measures: Secondary | ICD-10-CM

## 2024-06-01 DIAGNOSIS — R972 Elevated prostate specific antigen [PSA]: Secondary | ICD-10-CM

## 2024-06-01 MED ORDER — LEVOFLOXACIN 500 MG PO TABS
500.0000 mg | ORAL_TABLET | Freq: Once | ORAL | Status: AC
  Administered 2024-06-01: 500 mg via ORAL

## 2024-06-01 MED ORDER — GENTAMICIN SULFATE 40 MG/ML IJ SOLN
80.0000 mg | Freq: Once | INTRAMUSCULAR | Status: AC
  Administered 2024-06-01: 80 mg via INTRAMUSCULAR

## 2024-06-01 NOTE — Progress Notes (Signed)
   06/01/24  Indication: Elevated PSA, 4.5, abnormal prostate MRI  MRI Fusion Prostate Biopsy Procedure   Informed consent was obtained, and we discussed the risks of bleeding and infection/sepsis. A time out was performed to ensure correct patient identity.  Pre-Procedure: - Last PSA Level: 4.5 - Gentamicin  and levaquin  given for antibiotic prophylaxis - Prostate measured 53 g on MRI, PSA density 0.08 - MRI lesion was hypoechoic on ultrasound  Procedure: - Prostate block performed using 10 cc 1% lidocaine  - MRI fusion biopsy was performed, and 3 biopsies were taken from the ROI#1 PIRADS4  lesion located right peripheral apex - Standard template 6 biopsies taken - Total of 9 cores taken  Post-Procedure: - Patient tolerated the procedure well - He was counseled to seek immediate medical attention if experiences significant bleeding, fevers, or severe pain - Return in one week to discuss biopsy results  Assessment/ Plan: Will follow up in 1-2 weeks to discuss pathology with Dr. Twylla Redell Burnet, MD 06/01/2024

## 2024-06-01 NOTE — Patient Instructions (Addendum)

## 2024-06-01 NOTE — Addendum Note (Signed)
 Addended by: ELOUISE SANTA BROCKS on: 06/01/2024 02:58 PM   Modules accepted: Orders

## 2024-06-06 LAB — PROSTATE CORE NEEDLE BIOPSY

## 2024-06-07 ENCOUNTER — Ambulatory Visit: Payer: Self-pay | Admitting: Urology

## 2024-06-14 ENCOUNTER — Ambulatory Visit: Admitting: Urology

## 2024-12-05 ENCOUNTER — Other Ambulatory Visit

## 2024-12-08 ENCOUNTER — Ambulatory Visit: Admitting: Urology
